# Patient Record
Sex: Female | Born: 1975 | Race: White | Hispanic: No | Marital: Married | Smoking: Never smoker
Health system: Southern US, Community
[De-identification: ages and names within clinical notes are randomized; demographics above are authoritative.]

## PROBLEM LIST (undated history)

## (undated) DIAGNOSIS — J45901 Unspecified asthma with (acute) exacerbation: Secondary | ICD-10-CM

## (undated) HISTORY — DX: Unspecified asthma with (acute) exacerbation: J45.901

## (undated) HISTORY — PX: TUBAL LIGATION: SHX77

## (undated) HISTORY — PX: ENDOMETRIAL ABLATION: SHX621

## (undated) HISTORY — PX: DILATION AND CURETTAGE OF UTERUS: SHX78

---

## 2005-02-24 ENCOUNTER — Emergency Department: Payer: Self-pay | Admitting: Emergency Medicine

## 2008-09-05 ENCOUNTER — Ambulatory Visit: Payer: Self-pay

## 2009-03-16 ENCOUNTER — Ambulatory Visit: Payer: Self-pay

## 2009-03-17 ENCOUNTER — Ambulatory Visit: Payer: Self-pay

## 2010-04-14 ENCOUNTER — Ambulatory Visit: Payer: Self-pay

## 2010-04-18 ENCOUNTER — Inpatient Hospital Stay: Payer: Self-pay

## 2012-04-22 ENCOUNTER — Ambulatory Visit: Payer: Self-pay

## 2012-05-02 ENCOUNTER — Ambulatory Visit: Payer: Self-pay

## 2012-05-05 LAB — PATHOLOGY REPORT

## 2015-02-01 NOTE — Op Note (Signed)
PATIENT NAMVernelle Villa:  Gul, Lashika R MR#:  [id number] DATE OF BIRTH:  [birth year]  DATE OF PROCEDURE:  05/02/2012  PREOPERATIVE DIAGNOSIS: Menorrhagia.   POSTOPERATIVE DIAGNOSIS: Menorrhagia.  PROCEDURES PERFORMED: Dilation and curettage, hysteroscopy, NovaSure endometrial ablation.   SURGEON: Deloris Pinghilip J. Luella Cookosenow, M.D.   OPERATIVE FINDINGS: No abnormal masses. Sounding length 9, cervical length 4, cavity length 5, cavity width 4, power 110, time 152.  DESCRIPTION OF PROCEDURE: After adequate general anesthesia, the patient was prepped and draped in routine fashion. The cervix was grasped with a Gerilyn PilgrimJacob tenaculum and dilated with ease. Visualization of the uterine cavity revealed the cavity to be totally free of abnormality. Uterine curettage was performed with return of minimal amount of tissue. A NovaSure endometrial ablation was performed in routine fashion in 1 minute, 52 seconds. The patient tolerated the procedure well and left the Operating Room in good condition. Sponge and needle counts were said to be correct at the end of the procedure.  ____________________________ Deloris PingPhilip J. Luella Cookosenow, MD pjr:slb D: 05/02/2012 12:27:27 ET T: 05/02/2012 12:55:27 ET JOB#: [id number]  cc: Deloris PingPhilip J. Luella Cookosenow, MD, <Dictator> Towana BadgerPHILIP J ROSENOW MD ELECTRONICALLY SIGNED 05/04/2012 22:30

## 2016-12-25 ENCOUNTER — Ambulatory Visit (INDEPENDENT_AMBULATORY_CARE_PROVIDER_SITE_OTHER): Payer: BC Managed Care – PPO | Admitting: Obstetrics and Gynecology

## 2016-12-25 ENCOUNTER — Encounter: Payer: Self-pay | Admitting: Obstetrics and Gynecology

## 2016-12-25 VITALS — BP 98/62 | HR 47 | Ht 64.0 in | Wt 128.0 lb

## 2016-12-25 DIAGNOSIS — Z01419 Encounter for gynecological examination (general) (routine) without abnormal findings: Secondary | ICD-10-CM | POA: Diagnosis not present

## 2016-12-25 DIAGNOSIS — Z1231 Encounter for screening mammogram for malignant neoplasm of breast: Secondary | ICD-10-CM | POA: Diagnosis not present

## 2016-12-25 DIAGNOSIS — Z1239 Encounter for other screening for malignant neoplasm of breast: Secondary | ICD-10-CM

## 2016-12-25 NOTE — Progress Notes (Signed)
Gynecology Annual Exam  PCP: Danella PentonMark F Miller, MD  Chief Complaint  Patient presents with  . Gynecologic Exam    History of Present Illness:  Ms. Tammy Villa is a 41 y.o. [id number] who LMP was No LMP recorded. Patient has had an ablation., presents today for her annual examination.  Her menses are absent,  She is has sex with males.  Last Pap: 2 year ago Results were: no abnormalities /neg HPV DNA negative Hx of STDs: none  Last mammogram: never There is no FH of breast cancer. There is no FH of ovarian cancer. The patient does not do self-breast exams.  Tobacco use: The patient denies current or previous tobacco use. Alcohol use: none Exercise: very active  She does get adequate calcium and Vitamin D in her diet.  The patient is sexually active. The patient wears seatbelts: yes.      Review of Systems: Review of Systems  Constitutional: Negative.   HENT: Negative.   Eyes: Negative.   Respiratory: Negative.   Cardiovascular: Negative.   Gastrointestinal: Negative.   Genitourinary: Negative.   Musculoskeletal: Negative.   Skin: Negative.   Neurological: Negative.   Endo/Heme/Allergies: Negative.   Psychiatric/Behavioral: Negative.     Past Medical History:  Diagnosis Date  . Asthma attack     Past Surgical History:  Procedure Laterality Date  . CESAREAN SECTION    . DILATION AND CURETTAGE OF UTERUS    . ENDOMETRIAL ABLATION    . TUBAL LIGATION      Medications: None      Allergies  Allergen Reactions  . Penicillins     Gynecologic History: No LMP recorded. Patient has had an ablation.  Obstetric History: [id number]  Social History   Social History  . Marital status: Married    Spouse name: N/A  . Number of children: N/A  . Years of education: N/A   Occupational History  . Not on file.   Social History Main Topics  . Smoking status: Never Smoker  . Smokeless tobacco: Never Used  . Alcohol use Yes  . Drug use: No  . Sexual activity: Yes   Birth control/ protection: Surgical   Other Topics Concern  . Not on file   Social History Narrative  . No narrative on file    Family History  Problem Relation Age of Onset  . Hypertension Mother   . Hypothyroidism Mother   . Diabetes Maternal Grandmother   . Esophageal cancer Maternal Grandfather      Physical Exam BP 98/62 (Patient Position: Sitting)   Pulse (!) 47   Ht 5\' 4"  (1.626 m)   Wt 128 lb (58.1 kg)   BMI 21.97 kg/m   General: NAD HEENT: normocephalic, anicteric Thyroid: no enlargement, no palpable nodules Pulmonary: No increased work of breathing, CTAB Cardiovascular: RRR, distal pulses 2+ Breast: Breast symmetrical, no tenderness, no palpable nodules or masses, no skin or nipple retraction present, no nipple discharge.  No axillary or supraclavicular lymphadenopathy. Abdomen: NABS, soft, non-tender, non-distended.  Umbilicus without lesions.  No hepatomegaly, splenomegaly or masses palpable. No evidence of hernia  Genitourinary:  External: Normal external female genitalia.  Normal urethral meatus, normal  Bartholin's and Skene's glands.    Vagina: Normal vaginal mucosa, no evidence of prolapse.    Cervix: Grossly normal in appearance, no bleeding  Uterus: Non-enlarged, mobile, normal contour.  No CMT  Adnexa: ovaries non-enlarged, no adnexal masses  Rectal: deferred  Lymphatic: no evidence of inguinal lymphadenopathy Extremities: no edema,  erythema, or tenderness Neurologic: Grossly intact Psychiatric: mood appropriate, affect full  Female chaperone present for pelvic and breast  portions of the physical exam  Assessment: 41 y.o. [id number] Here for routine annual gynecologic exam.  Doing well.  Plan:  1) STI screening was offered: declined  2) Mammogram: due. She will call [facility] to schedule.   3) Pap - ASCCP guidelines and rational discussed.  Patient opts for routine  screening interval (3-5 year intervals)  4) Routine healthcare  maintenance including cholesterol, diabetes screening per PCP, Dr. Hyacinth Meeker  5) Follow up 1 year for routine annual exam  Thomasene Mohair, MD 12/25/2016 4:04 PM

## 2017-04-03 ENCOUNTER — Ambulatory Visit: Payer: Self-pay

## 2017-04-11 ENCOUNTER — Ambulatory Visit
Admission: RE | Admit: 2017-04-11 | Discharge: 2017-04-11 | Disposition: A | Discharge status: Home / Self Care | Payer: BC Managed Care – PPO | Source: Ambulatory Visit | Attending: Obstetrics and Gynecology | Admitting: Obstetrics and Gynecology

## 2017-04-11 DIAGNOSIS — Z01419 Encounter for gynecological examination (general) (routine) without abnormal findings: Secondary | ICD-10-CM

## 2017-04-11 DIAGNOSIS — Z1239 Encounter for other screening for malignant neoplasm of breast: Secondary | ICD-10-CM

## 2017-04-11 DIAGNOSIS — Z1231 Encounter for screening mammogram for malignant neoplasm of breast: Secondary | ICD-10-CM | POA: Insufficient documentation

## 2018-02-17 DIAGNOSIS — E782 Mixed hyperlipidemia: Secondary | ICD-10-CM | POA: Insufficient documentation

## 2018-05-15 DIAGNOSIS — E28319 Asymptomatic premature menopause: Secondary | ICD-10-CM | POA: Insufficient documentation

## 2018-10-23 ENCOUNTER — Other Ambulatory Visit: Payer: Self-pay | Admitting: Internal Medicine

## 2018-10-23 DIAGNOSIS — Z1231 Encounter for screening mammogram for malignant neoplasm of breast: Secondary | ICD-10-CM

## 2018-11-11 ENCOUNTER — Ambulatory Visit
Admission: RE | Admit: 2018-11-11 | Discharge: 2018-11-11 | Disposition: A | Discharge status: Home / Self Care | Payer: BC Managed Care – PPO | Source: Ambulatory Visit | Attending: Internal Medicine | Admitting: Internal Medicine

## 2018-11-11 DIAGNOSIS — Z1231 Encounter for screening mammogram for malignant neoplasm of breast: Secondary | ICD-10-CM | POA: Diagnosis present

## 2018-11-13 ENCOUNTER — Other Ambulatory Visit: Payer: Self-pay | Admitting: Internal Medicine

## 2018-11-13 DIAGNOSIS — N6489 Other specified disorders of breast: Secondary | ICD-10-CM

## 2018-11-13 DIAGNOSIS — R928 Other abnormal and inconclusive findings on diagnostic imaging of breast: Secondary | ICD-10-CM

## 2018-11-20 ENCOUNTER — Ambulatory Visit
Admission: RE | Admit: 2018-11-20 | Discharge: 2018-11-20 | Disposition: A | Discharge status: Home / Self Care | Payer: BC Managed Care – PPO | Source: Ambulatory Visit | Attending: Internal Medicine | Admitting: Internal Medicine

## 2018-11-20 ENCOUNTER — Other Ambulatory Visit: Payer: Self-pay | Admitting: Internal Medicine

## 2018-11-20 DIAGNOSIS — N6489 Other specified disorders of breast: Secondary | ICD-10-CM | POA: Diagnosis present

## 2018-11-20 DIAGNOSIS — R928 Other abnormal and inconclusive findings on diagnostic imaging of breast: Secondary | ICD-10-CM | POA: Diagnosis not present

## 2019-12-13 ENCOUNTER — Ambulatory Visit: Payer: BC Managed Care – PPO | Attending: Internal Medicine

## 2019-12-13 DIAGNOSIS — Z23 Encounter for immunization: Secondary | ICD-10-CM | POA: Insufficient documentation

## 2019-12-13 NOTE — Progress Notes (Signed)
   Covid-19 Vaccination Clinic  Name:  Tammy Villa    MRN: [id number] DOB: 16-Nov-1975  12/13/2019  Tammy Villa was observed post Covid-19 immunization for 15 minutes without incidence. She was provided with Vaccine Information Sheet and instruction to access the V-Safe system.   Tammy Villa was instructed to call 911 with any severe reactions post vaccine: Marland Kitchen Difficulty breathing  . Swelling of your face and throat  . A fast heartbeat  . A bad rash all over your body  . Dizziness and weakness    Immunizations Administered    Name Date Dose VIS Date Route   Pfizer COVID-19 Vaccine 12/13/2019 11:24 AM 0.3 mL 09/25/2019 Intramuscular   Manufacturer: [employer], [employer]   Lot: [id number]   NDC: [address]-3

## 2020-01-05 ENCOUNTER — Ambulatory Visit: Payer: BC Managed Care – PPO | Attending: Internal Medicine

## 2020-01-05 DIAGNOSIS — Z23 Encounter for immunization: Secondary | ICD-10-CM

## 2020-01-05 NOTE — Progress Notes (Signed)
   Covid-19 Vaccination Clinic  Name:  AKILI CUDA    MRN: [id number] DOB: May 30, 1976  01/05/2020  Ms. Nong was observed post Covid-19 immunization for 15 minutes without incident. She was provided with Vaccine Information Sheet and instruction to access the V-Safe system.   Ms. Heard was instructed to call 911 with any severe reactions post vaccine: Marland Kitchen Difficulty breathing  . Swelling of face and throat  . A fast heartbeat  . A bad rash all over body  . Dizziness and weakness   Immunizations Administered    Name Date Dose VIS Date Route   Pfizer COVID-19 Vaccine 01/05/2020  9:12 AM 0.3 mL 09/25/2019 Intramuscular   Manufacturer: [employer], [employer]   Lot: [id number]   NDC: [address]-2

## 2020-03-10 ENCOUNTER — Other Ambulatory Visit: Payer: Self-pay | Admitting: Internal Medicine

## 2020-03-10 DIAGNOSIS — Z1231 Encounter for screening mammogram for malignant neoplasm of breast: Secondary | ICD-10-CM

## 2020-03-15 ENCOUNTER — Ambulatory Visit
Admission: RE | Admit: 2020-03-15 | Discharge: 2020-03-15 | Disposition: A | Discharge status: Home / Self Care | Payer: BC Managed Care – PPO | Source: Ambulatory Visit | Attending: Internal Medicine | Admitting: Internal Medicine

## 2020-03-15 DIAGNOSIS — Z1231 Encounter for screening mammogram for malignant neoplasm of breast: Secondary | ICD-10-CM | POA: Insufficient documentation

## 2020-03-23 ENCOUNTER — Ambulatory Visit (INDEPENDENT_AMBULATORY_CARE_PROVIDER_SITE_OTHER): Payer: BC Managed Care – PPO | Admitting: Obstetrics

## 2020-03-23 ENCOUNTER — Other Ambulatory Visit (HOSPITAL_COMMUNITY)
Admission: RE | Admit: 2020-03-23 | Discharge: 2020-03-23 | Disposition: A | Discharge status: Home / Self Care | Payer: BC Managed Care – PPO | Source: Ambulatory Visit | Attending: Obstetrics | Admitting: Obstetrics

## 2020-03-23 ENCOUNTER — Other Ambulatory Visit: Payer: Self-pay

## 2020-03-23 ENCOUNTER — Encounter: Payer: Self-pay | Admitting: Obstetrics

## 2020-03-23 VITALS — BP 120/80 | Ht 64.0 in | Wt 136.0 lb

## 2020-03-23 DIAGNOSIS — Z124 Encounter for screening for malignant neoplasm of cervix: Secondary | ICD-10-CM | POA: Diagnosis present

## 2020-03-23 DIAGNOSIS — Z01419 Encounter for gynecological examination (general) (routine) without abnormal findings: Secondary | ICD-10-CM | POA: Diagnosis not present

## 2020-03-23 DIAGNOSIS — Z0000 Encounter for general adult medical examination without abnormal findings: Secondary | ICD-10-CM

## 2020-03-23 NOTE — Progress Notes (Addendum)
Gynecology Annual Exam  PCP: Danella Penton, MD  Chief Complaint:  Chief Complaint  Patient presents with  . Gynecologic Exam    History of Present Illness: Tammy Villa is a 45 y.o. [id number] presents for annual exam. The patient has no complaints today.  Her menses are absent due to early menopause. Marland Kitchen Her last menstrual period was several years ago .Her last pap smear was donein 2016 and was normal per her report.The patient is  sexually active. She had an ablation.   Since her last visit, she has had no significant changes in her health.  Her past medical history is remarkable for Menorrhagia- treated with an ablation. Menopausal sxs treated with HRT (oral Estrogen and Progesterone) and managed by her PCP.  The patient does perform self breast exams. Her last mammogram was in 2016, results were NILM.   There is no family history of breast cancer. Genetic testing has not been done.   There is no family history of ovarian cancer. Genetic testing has not been done.  The patient denies smoking.  She reports drinking alcohol. She reports have 1-2 drinks per week.   She denies illegal drug use.  The patient reports exercising regularly.  The patient denies current symptoms of depression.    Review of Systems: ROS  Past Medical History:  Past Medical History:  Diagnosis Date  . Asthma attack     Past Surgical History:  Past Surgical History:  Procedure Laterality Date  . CESAREAN SECTION    . DILATION AND CURETTAGE OF UTERUS    . ENDOMETRIAL ABLATION    . TUBAL LIGATION      Family History:  Family History  Problem Relation Age of Onset  . Hypertension Mother   . Hypothyroidism Mother   . Diabetes Maternal Grandmother   . Esophageal cancer Maternal Grandfather   . Breast cancer Neg Hx     Social History:  Social History   Socioeconomic History  . Marital status: Married    Spouse name: Not on file  . Number of children: Not on file  . Years  of education: Not on file  . Highest education level: Not on file  Occupational History  . Not on file  Tobacco Use  . Smoking status: Never Smoker  . Smokeless tobacco: Never Used  Substance and Sexual Activity  . Alcohol use: Yes  . Drug use: No  . Sexual activity: Yes    Birth control/protection: Surgical  Other Topics Concern  . Not on file  Social History Narrative  . Not on file   Social Determinants of Health   Financial Resource Strain:   . Difficulty of Paying Living Expenses:   Food Insecurity:   . Worried About Programme researcher, broadcasting/film/video in the Last Year:   . Barista in the Last Year:   Transportation Needs:   . Freight forwarder (Medical):   Marland Kitchen Lack of Transportation (Non-Medical):   Physical Activity:   . Days of Exercise per Week:   . Minutes of Exercise per Session:   Stress:   . Feeling of Stress :   Social Connections:   . Frequency of Communication with Friends and Family:   . Frequency of Social Gatherings with Friends and Family:   . Attends Religious Services:   . Active Member of Clubs or Organizations:   . Attends Banker Meetings:   Marland Kitchen Marital Status:   Intimate Partner Violence:   .  Fear of Current or Ex-Partner:   . Emotionally Abused:   Marland Kitchen Physically Abused:   . Sexually Abused:     Allergies:  Allergies  Allergen Reactions  . Penicillins     Medications: Prior to Admission medications   Medication Sig Start Date End Date Taking? Authorizing Provider  escitalopram (LEXAPRO) 10 MG tablet Take 10 mg by mouth daily. 03/09/20  Yes [provider]  estradiol (ESTRACE) 1 MG tablet TAKE 1 TABLET(1 MG) BY MOUTH EVERY DAY 12/30/19  Yes [provider]  progesterone (PROMETRIUM) 100 MG capsule Take by mouth. 05/19/19 05/18/20 Yes [provider]  simvastatin (ZOCOR) 10 MG tablet Take 10 mg by mouth at bedtime. 02/08/20  Yes [provider]    Physical Exam Vitals: Blood pressure 120/80, height  5\' 4"  (1.626 m), weight 136 lb (61.7 kg).  General: NAD HEENT: normocephalic, anicteric Neck: no thyroid enlargement, no palpable nodules, no cervical lymphadenopathy  Pulmonary: No increased work of breathing, CTAB Cardiovascular: RRR, without murmur  Breast: Breast symmetrical, no tenderness, no palpable nodules or masses, no skin or nipple retraction present, no nipple discharge.  No axillary, infraclavicular or supraclavicular lymphadenopathy. Abdomen: Soft, non-tender, non-distended.  Umbilicus without lesions.  No hepatomegaly or masses palpable. No evidence of hernia. Genitourinary:  External: Normal external female genitalia.  Normal urethral meatus, normal  Bartholin's and Skene's glands.    Vagina: Normal vaginal mucosa, no evidence of prolapse.    Cervix: Grossly normal in appearance, no bleeding, non-tender  Uterus: Anteverted, normal size, shape, and consistency, mobile, and non-tender  Adnexa: No adnexal masses, non-tender  Rectal: deferred  Lymphatic: no evidence of inguinal lymphadenopathy Extremities: no edema, erythema, or tenderness Neurologic: Grossly intact Psychiatric: mood appropriate, affect full     Assessment: 44 y.o. [id number] No problem-specific Assessment & Plan notes found for this encounter.   Plan:  Reviewed preventive care, including annual mammograms, colonoscopy q 10 years  1) Breast cancer screening - recommend monthly self breast exam. Mammogram is up to date.  2) STI screening was offered and declined.  3) Cervical cancer screening - Pap was done. ASCCP guidelines and rational discussed.  Patient opts for every 5 years screening interval  4) Contraception - Education given regarding options for contraception  5) Routine healthcare maintenance including cholesterol and diabetes screening managed by PCP   6) RTC in one year for next annual GYN physical. 7) Discussed her HRT use and the option of switching from oral Estrogen to weekly patches  to avoid the "first pass" Per her request, labs drawn today: Vitamin D, TSH, and CMP.

## 2020-03-24 ENCOUNTER — Encounter: Payer: Self-pay | Admitting: Obstetrics

## 2020-03-24 LAB — CBC WITH DIFFERENTIAL
Basophils Absolute: 0 10*3/uL (ref 0.0–0.2)
Basos: 1 %
EOS (ABSOLUTE): 0.1 10*3/uL (ref 0.0–0.4)
Eos: 3 %
Hematocrit: 41.8 % (ref 34.0–46.6)
Hemoglobin: 13.6 g/dL (ref 11.1–15.9)
Immature Grans (Abs): 0 10*3/uL (ref 0.0–0.1)
Immature Granulocytes: 0 %
Lymphocytes Absolute: 1.8 10*3/uL (ref 0.7–3.1)
Lymphs: 40 %
MCH: 28.5 pg (ref 26.6–33.0)
MCHC: 32.5 g/dL (ref 31.5–35.7)
MCV: 88 fL (ref 79–97)
Monocytes Absolute: 0.4 10*3/uL (ref 0.1–0.9)
Monocytes: 8 %
Neutrophils Absolute: 2.2 10*3/uL (ref 1.4–7.0)
Neutrophils: 48 %
RBC: 4.77 x10E6/uL (ref 3.77–5.28)
RDW: 12 % (ref 11.7–15.4)
WBC: 4.5 10*3/uL (ref 3.4–10.8)

## 2020-03-24 LAB — COMPREHENSIVE METABOLIC PANEL
ALT: 11 IU/L (ref 0–32)
AST: 20 IU/L (ref 0–40)
Albumin/Globulin Ratio: 1.9 (ref 1.2–2.2)
Albumin: 4.6 g/dL (ref 3.8–4.8)
Alkaline Phosphatase: 71 IU/L (ref 48–121)
BUN/Creatinine Ratio: 19 (ref 9–23)
BUN: 17 mg/dL (ref 6–24)
Bilirubin Total: 0.3 mg/dL (ref 0.0–1.2)
CO2: 25 mmol/L (ref 20–29)
Calcium: 8.9 mg/dL (ref 8.7–10.2)
Chloride: 102 mmol/L (ref 96–106)
Creatinine, Ser: 0.89 mg/dL (ref 0.57–1.00)
GFR calc Af Amer: 91 mL/min/{1.73_m2} (ref >59)
GFR calc non Af Amer: 79 mL/min/{1.73_m2} (ref >59)
Globulin, Total: 2.4 g/dL (ref 1.5–4.5)
Glucose: 84 mg/dL (ref 65–99)
Potassium: 4.6 mmol/L (ref 3.5–5.2)
Sodium: 139 mmol/L (ref 134–144)
Total Protein: 7 g/dL (ref 6.0–8.5)

## 2020-03-24 LAB — VITAMIN D 25 HYDROXY (VIT D DEFICIENCY, FRACTURES): Vit D, 25-Hydroxy: 32.8 ng/mL (ref 30.0–100.0)

## 2020-03-24 LAB — TSH: TSH: 2.7 u[IU]/mL (ref 0.450–4.500)

## 2020-03-24 NOTE — Progress Notes (Signed)
Letter to patient printed out with her recent lab values. Will have this mailed as she has not set up My Chart. Mirna Mires, CNM  03/24/2020 1:30 PM

## 2020-03-25 LAB — CYTOLOGY - PAP
Comment: NEGATIVE
Diagnosis: NEGATIVE
High risk HPV: NEGATIVE

## 2020-04-29 ENCOUNTER — Other Ambulatory Visit: Payer: Self-pay | Admitting: Physician Assistant

## 2020-04-29 ENCOUNTER — Other Ambulatory Visit: Payer: Self-pay

## 2020-04-29 ENCOUNTER — Ambulatory Visit
Admission: RE | Admit: 2020-04-29 | Discharge: 2020-04-29 | Disposition: A | Discharge status: Home / Self Care | Payer: BC Managed Care – PPO | Source: Ambulatory Visit | Attending: Physician Assistant | Admitting: Physician Assistant

## 2020-04-29 ENCOUNTER — Other Ambulatory Visit (HOSPITAL_COMMUNITY): Payer: Self-pay | Admitting: Physician Assistant

## 2020-04-29 DIAGNOSIS — M79606 Pain in leg, unspecified: Secondary | ICD-10-CM

## 2020-06-06 ENCOUNTER — Telehealth: Payer: Self-pay

## 2020-06-06 NOTE — Telephone Encounter (Signed)
Patient reports her labs from 03/23/20 w/[facility] were denied and are usually covered. Patient inquiring if they need a different code or if it can be refiled with a different code or more information as to why it is needed. LD#[phone]

## 2020-06-13 NOTE — Telephone Encounter (Signed)
LMVM to notify patient I am reviewing the lab and coding. Will contact her back when determined if lab is able to be refiled w/another code.

## 2020-06-13 NOTE — Telephone Encounter (Signed)
Spoke w/[facility]. Given code Z13.21 (screening for nutritional disorder) to refile for both Vitamin D and HgbA1c. Ignore bills for next 30-45 days. Inquire w/[facility] for any further questions.

## 2020-06-13 NOTE — Telephone Encounter (Signed)
Pt aware.

## 2021-01-23 IMAGING — MG DIGITAL SCREENING BILATERAL MAMMOGRAM WITH TOMO AND CAD
8 series · 9 of 24 positions shown · non-contrast
Comparison: Previous exam(s).

CLINICAL DATA: Screening.

EXAM:
DIGITAL SCREENING BILATERAL MAMMOGRAM WITH TOMO AND CAD

[R CC synth-2D]
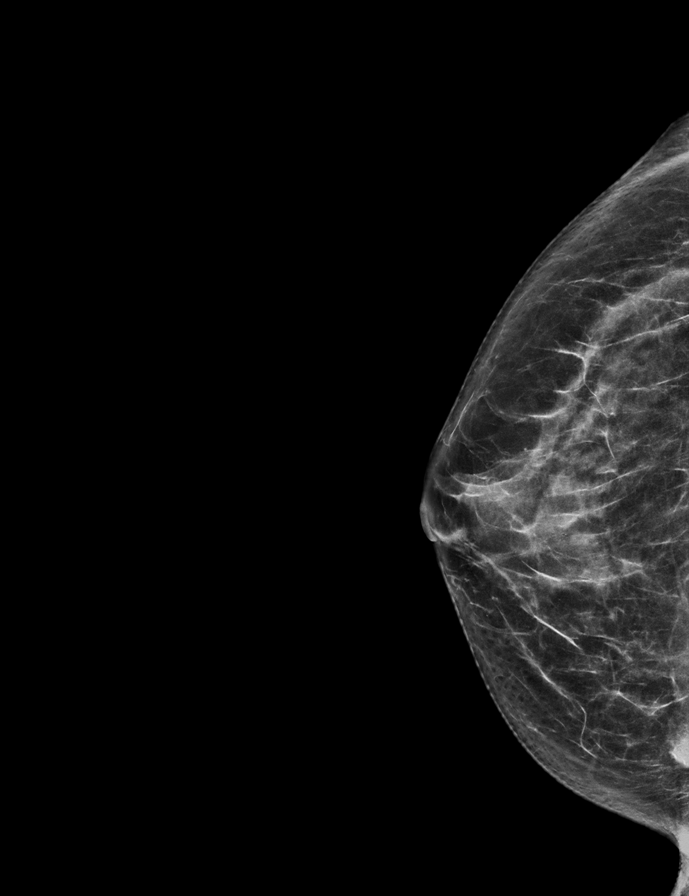

[L MLO synth-2D]
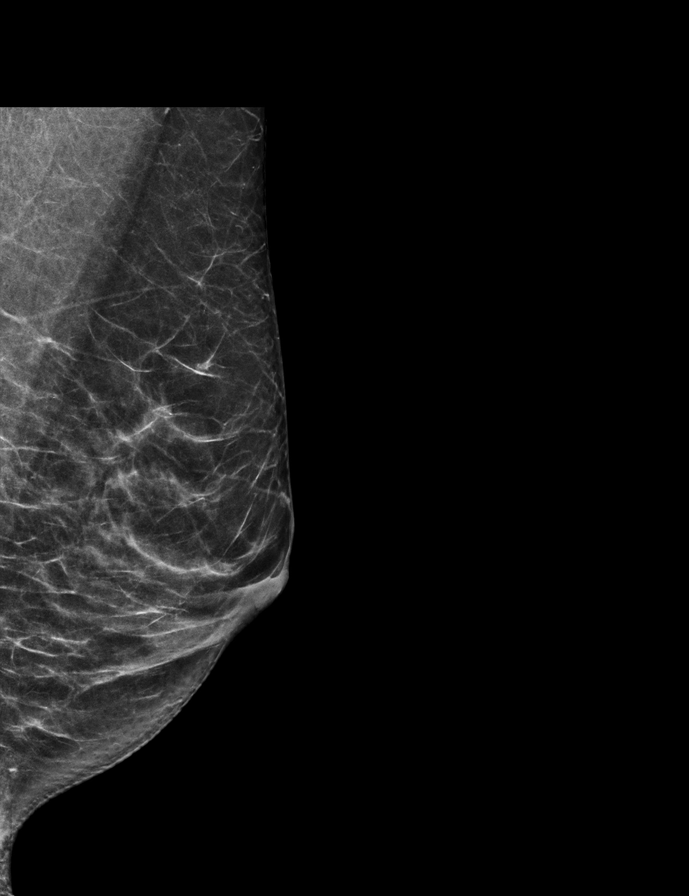

[L CC synth-2D]
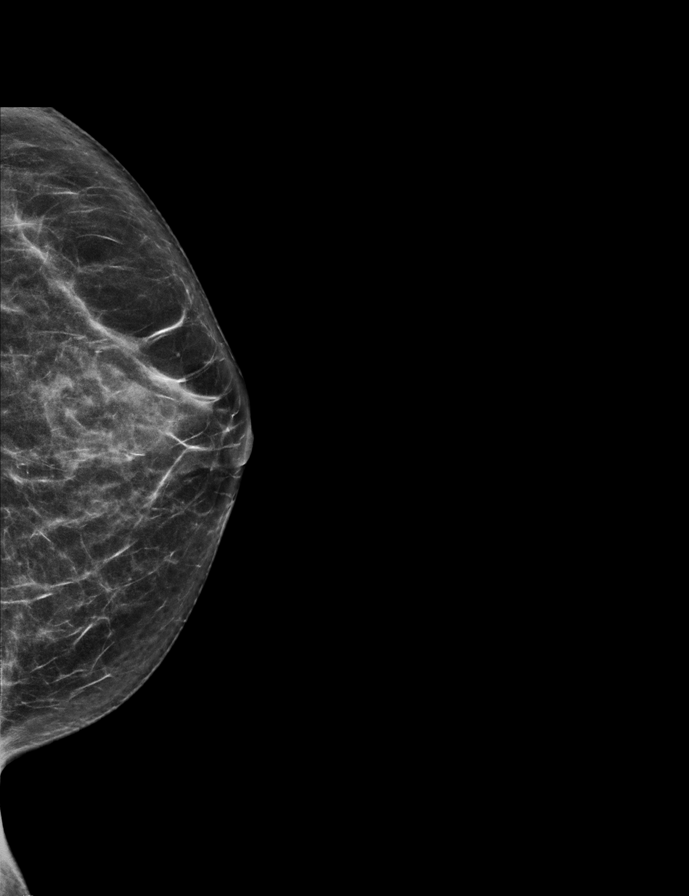

[R MLO synth-2D]
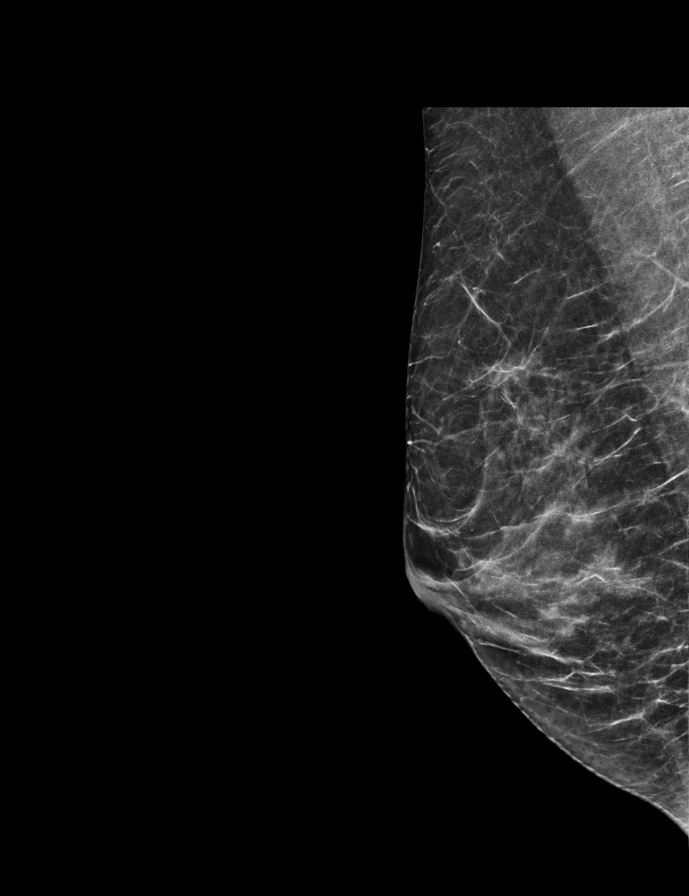

[L MLO tomo · 2 of 57 frames shown]
[frame 19/57]
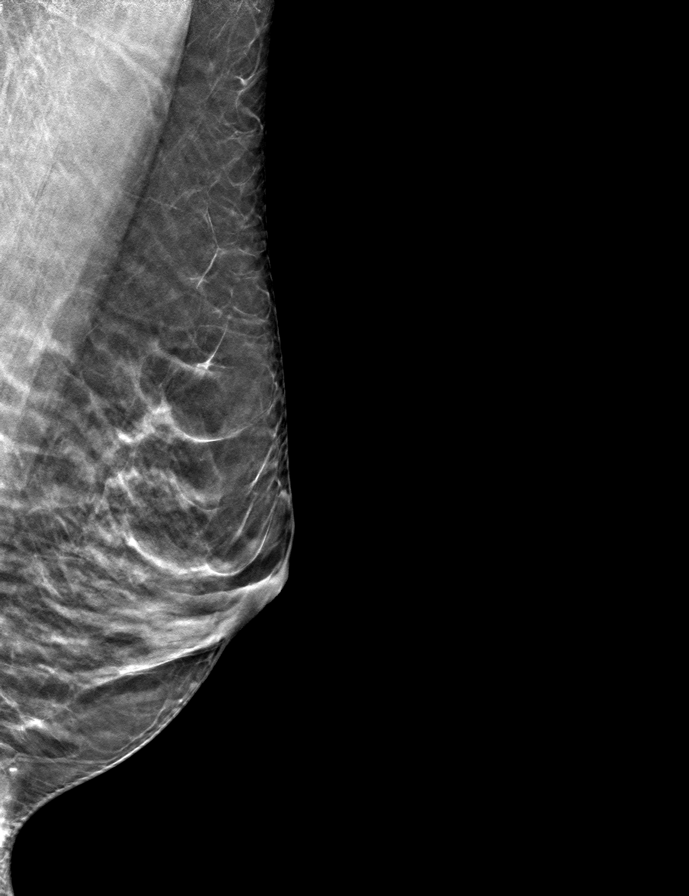
[frame 29/57]
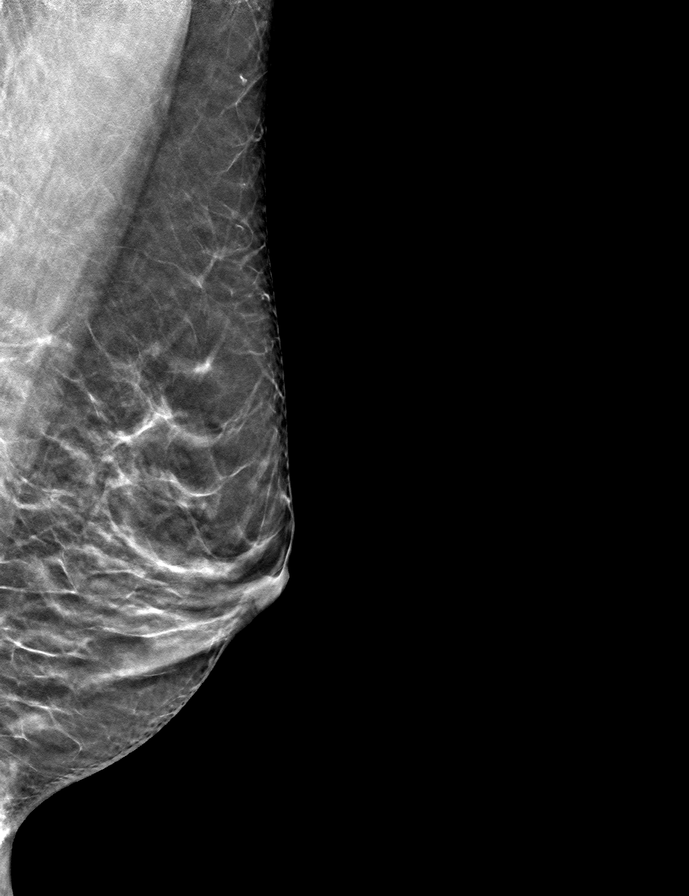

[R CC tomo · tomo slice 31/62.0]
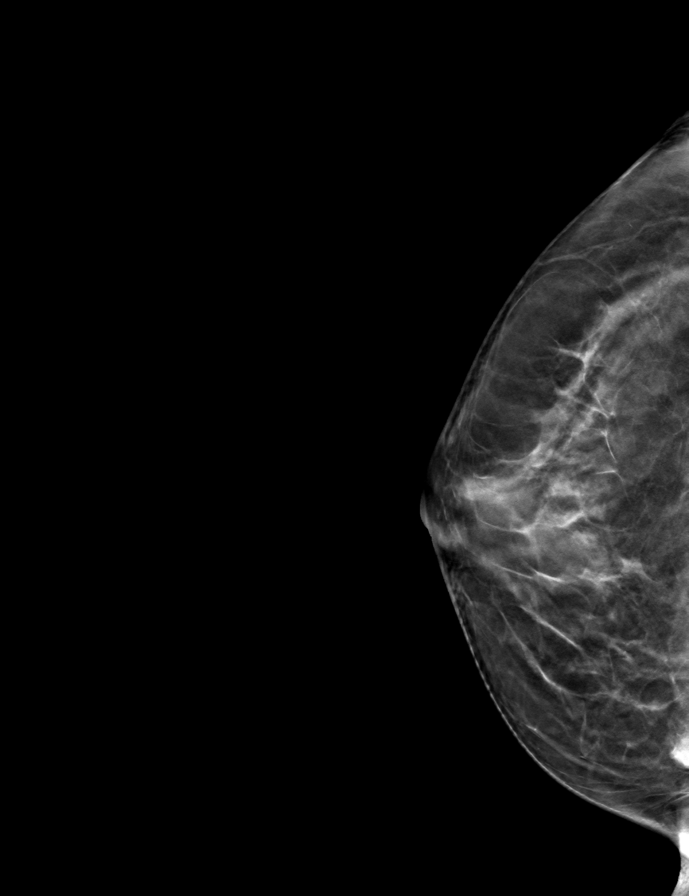

[R MLO tomo · tomo slice 25/49.0]
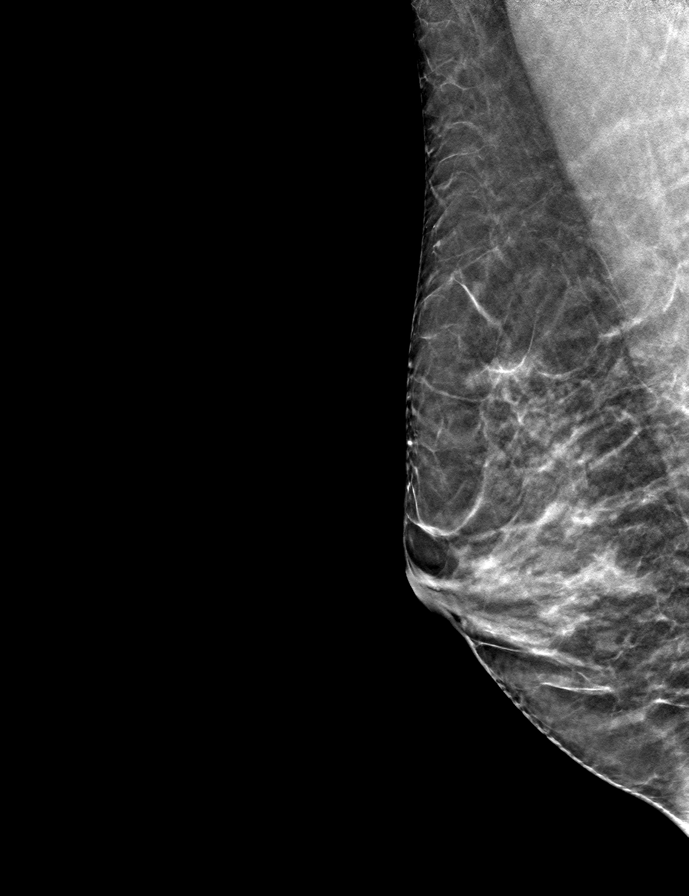

[L CC tomo · tomo slice 30/59.0]
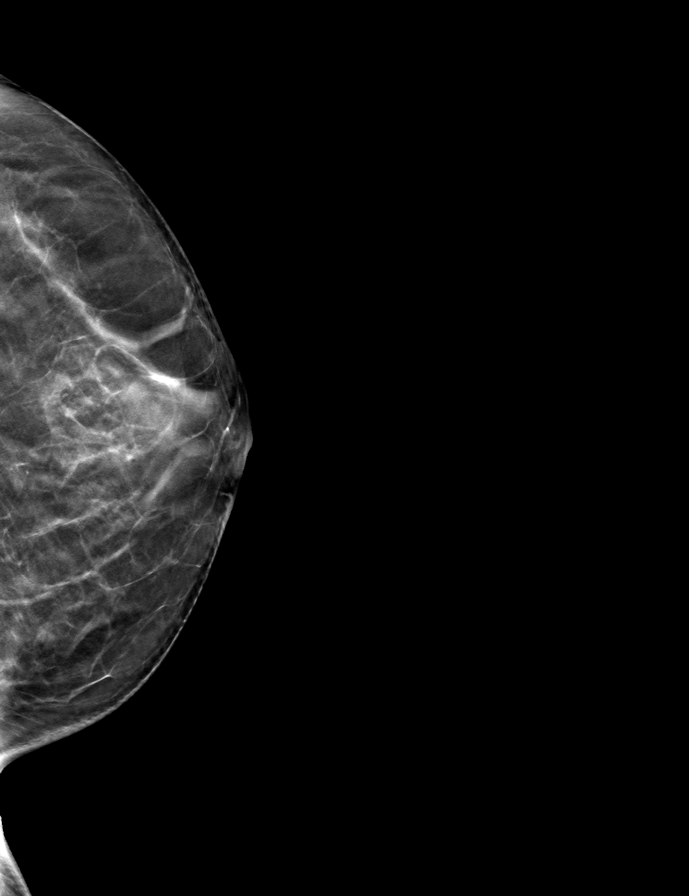

[9 of 24 positions shown; findings below may reference images not displayed]

ACR Breast Density Category c: The breast tissue is heterogeneously
dense, which may obscure small masses.
FINDINGS: In the right breast, a possible asymmetry warrants further
evaluation. In the left breast, no findings suspicious for
malignancy. Images were processed with CAD.
IMPRESSION: Further evaluation is suggested for possible asymmetry in the right
breast.

RECOMMENDATION:
Diagnostic mammogram and possibly ultrasound of the right breast.
(Code:[id number])

The patient will be contacted regarding the findings, and additional
imaging will be scheduled.

BI-RADS CATEGORY  0: Incomplete. Need additional imaging evaluation
and/or prior mammograms for comparison.

## 2021-05-29 ENCOUNTER — Other Ambulatory Visit: Payer: Self-pay | Admitting: Internal Medicine

## 2021-05-29 DIAGNOSIS — Z1231 Encounter for screening mammogram for malignant neoplasm of breast: Secondary | ICD-10-CM

## 2021-06-06 ENCOUNTER — Telehealth: Payer: Self-pay

## 2021-06-06 NOTE — Telephone Encounter (Signed)
Pt calling; believes she has BV again; get them a lot; usually uses metronidazole rx instead of coming in; has a hard time coming in with work.  [phone]

## 2021-06-06 NOTE — Telephone Encounter (Signed)
Patient is scheduled for 06/08/21 with [facility] for pelvic and 07/11/21 with [facility] for annual

## 2021-06-08 ENCOUNTER — Other Ambulatory Visit: Payer: Self-pay

## 2021-06-08 ENCOUNTER — Ambulatory Visit (INDEPENDENT_AMBULATORY_CARE_PROVIDER_SITE_OTHER): Payer: BC Managed Care – PPO | Admitting: Obstetrics and Gynecology

## 2021-06-08 ENCOUNTER — Encounter: Payer: Self-pay | Admitting: Obstetrics and Gynecology

## 2021-06-08 VITALS — BP 100/62 | HR 62 | Ht 64.0 in | Wt 141.0 lb

## 2021-06-08 DIAGNOSIS — R399 Unspecified symptoms and signs involving the genitourinary system: Secondary | ICD-10-CM

## 2021-06-08 DIAGNOSIS — N898 Other specified noninflammatory disorders of vagina: Secondary | ICD-10-CM

## 2021-06-08 LAB — POCT WET PREP WITH KOH
Clue Cells Wet Prep HPF POC: NEGATIVE
KOH Prep POC: NEGATIVE
Trichomonas, UA: NEGATIVE
Yeast Wet Prep HPF POC: NEGATIVE

## 2021-06-08 LAB — POCT URINALYSIS DIPSTICK
Bilirubin, UA: NEGATIVE
Blood, UA: NEGATIVE
Glucose, UA: NEGATIVE
Ketones, UA: NEGATIVE
Leukocytes, UA: NEGATIVE
Nitrite, UA: NEGATIVE
Protein, UA: NEGATIVE
Spec Grav, UA: 1.01 (ref 1.010–1.025)
pH, UA: 6 (ref 5.0–8.0)

## 2021-06-08 NOTE — Progress Notes (Signed)
Tammy Penton, MD   Chief Complaint  Patient presents with   Vaginal Discharge    BV/UTI. Some urgency w/urinating. Had Cipro on hand, took, UTI symptoms are better. Itching w/o odor or d/c also subsided past few days.    HPI:      Ms. Tammy Villa is a 45 y.o. [phone] whose LMP was No LMP recorded. Patient has had an ablation., presents today for UTI sx of urgency, frequency with good flow, dysuria, and mild pelvic discomfort over the wknd, no hematuria, LBP, fevers. Had leftover cipro from a prior UTI and took BID for 3 days. UTI sx resolved. Had increased caffeine use last wk, now drinking more water. Also has some mild vaginal itching without d/c, fishy odor. No meds to treat. Hx of BV in past. Just wants to make sure doesn't need tx.   Past Medical History:  Diagnosis Date   Asthma attack     Past Surgical History:  Procedure Laterality Date   CESAREAN SECTION     DILATION AND CURETTAGE OF UTERUS     ENDOMETRIAL ABLATION     TUBAL LIGATION      Family History  Problem Relation Age of Onset   Hypertension Mother    Hypothyroidism Mother    Diabetes Maternal Grandmother    Esophageal cancer Maternal Grandfather    Breast cancer Neg Hx     Social History   Socioeconomic History   Marital status: Married    Spouse name: Not on file   Number of children: Not on file   Years of education: Not on file   Highest education level: Not on file  Occupational History   Not on file  Tobacco Use   Smoking status: Never   Smokeless tobacco: Never  Substance and Sexual Activity   Alcohol use: Yes   Drug use: No   Sexual activity: Yes    Birth control/protection: Surgical  Other Topics Concern   Not on file  Social History Narrative   Not on file   Social Determinants of Health   Financial Resource Strain: Not on file  Food Insecurity: Not on file  Transportation Needs: Not on file  Physical Activity: Not on file  Stress: Not on file  Social Connections:  Not on file  Intimate Partner Violence: Not on file    Outpatient Medications Prior to Visit  Medication Sig Dispense Refill   albuterol (VENTOLIN HFA) 108 (90 Base) MCG/ACT inhaler Inhale into the lungs.     Cyanocobalamin 2500 MCG SUBL Place under the tongue.     escitalopram (LEXAPRO) 10 MG tablet Take 10 mg by mouth daily.     estradiol (ESTRACE) 1 MG tablet TAKE 1 TABLET(1 MG) BY MOUTH EVERY DAY     progesterone (PROMETRIUM) 100 MG capsule Take by mouth.     simvastatin (ZOCOR) 10 MG tablet Take 10 mg by mouth at bedtime.     tretinoin (RETIN-A) 0.025 % cream Apply topically.     progesterone (PROMETRIUM) 100 MG capsule Take by mouth.     No facility-administered medications prior to visit.      ROS:  Review of Systems  Constitutional:  Negative for fever.  Gastrointestinal:  Negative for blood in stool, constipation, diarrhea, nausea and vomiting.  Genitourinary:  Positive for dysuria, frequency and urgency. Negative for dyspareunia, flank pain, hematuria, vaginal bleeding, vaginal discharge and vaginal pain.  Musculoskeletal:  Negative for back pain.  Skin:  Negative for rash.  BREAST:  No symptoms   OBJECTIVE:   Vitals:  BP 100/62 (Cuff Size: Normal)   Pulse 62   Ht 5\' 4"  (1.626 m)   Wt 141 lb (64 kg)   BMI 24.20 kg/m   Physical Exam Vitals reviewed.  Constitutional:      Appearance: She is well-developed.  Pulmonary:     Effort: Pulmonary effort is normal.  Genitourinary:    General: Normal vulva.     Pubic Area: No rash.      Labia:        Right: No rash, tenderness or lesion.        Left: No rash, tenderness or lesion.      Vagina: Normal. No vaginal discharge, erythema or tenderness.     Cervix: Normal.     Uterus: Normal. Not enlarged and not tender.      Adnexa: Right adnexa normal and left adnexa normal.       Right: No mass or tenderness.         Left: No mass or tenderness.    Musculoskeletal:        General: Normal range of motion.      Cervical back: Normal range of motion.  Skin:    General: Skin is warm and dry.  Neurological:     General: No focal deficit present.     Mental Status: She is alert and oriented to person, place, and time.  Psychiatric:        Mood and Affect: Mood normal.        Behavior: Behavior normal.        Thought Content: Thought content normal.        Judgment: Judgment normal.    Results: Results for orders placed or performed in visit on 06/08/21 (from the past 24 hour(s))  POCT Urinalysis Dipstick     Status: Normal   Collection Time: 06/08/21  4:59 PM  Result Value Ref Range   Color, UA yellow    Clarity, UA clear    Glucose, UA Negative Negative   Bilirubin, UA neg    Ketones, UA neg    Spec Grav, UA 1.010 1.010 - 1.025   Blood, UA neg    pH, UA 6.0 5.0 - 8.0   Protein, UA Negative Negative   Urobilinogen, UA     Nitrite, UA neg    Leukocytes, UA Negative Negative   Appearance     Odor    POCT Wet Prep with KOH     Status: Normal   Collection Time: 06/08/21  4:59 PM  Result Value Ref Range   Trichomonas, UA Negative    Clue Cells Wet Prep HPF POC neg    Epithelial Wet Prep HPF POC     Yeast Wet Prep HPF POC neg    Bacteria Wet Prep HPF POC     RBC Wet Prep HPF POC     WBC Wet Prep HPF POC     KOH Prep POC Negative Negative     Assessment/Plan: UTI symptoms - Plan: POCT Urinalysis Dipstick; resolved after 3 days cipro tx. Neg UA today. Reassurance. F/u prn.   Vaginal itching - Plan: POCT Wet Prep with KOH; neg wet prep/exam. Will treat with diflucan if sx persist/worsen. F/u prn.     Return if symptoms worsen or fail to improve.  Veralyn Lopp B. Zelie Asbill, PA-C 06/08/2021 5:03 PM

## 2021-06-09 ENCOUNTER — Ambulatory Visit
Admission: RE | Admit: 2021-06-09 | Discharge: 2021-06-09 | Disposition: A | Discharge status: Home / Self Care | Payer: BC Managed Care – PPO | Source: Ambulatory Visit | Attending: Internal Medicine | Admitting: Internal Medicine

## 2021-06-09 DIAGNOSIS — Z1231 Encounter for screening mammogram for malignant neoplasm of breast: Secondary | ICD-10-CM | POA: Diagnosis not present

## 2021-07-11 ENCOUNTER — Ambulatory Visit: Payer: BC Managed Care – PPO | Admitting: Obstetrics

## 2021-08-01 ENCOUNTER — Encounter: Payer: Self-pay | Admitting: Obstetrics

## 2021-08-01 ENCOUNTER — Ambulatory Visit (INDEPENDENT_AMBULATORY_CARE_PROVIDER_SITE_OTHER): Payer: BC Managed Care – PPO | Admitting: Obstetrics

## 2021-08-01 ENCOUNTER — Other Ambulatory Visit: Payer: Self-pay

## 2021-08-01 VITALS — BP 112/66 | HR 52 | Ht 64.0 in | Wt 140.0 lb

## 2021-08-01 DIAGNOSIS — Z01419 Encounter for gynecological examination (general) (routine) without abnormal findings: Secondary | ICD-10-CM | POA: Diagnosis not present

## 2021-08-01 NOTE — Progress Notes (Signed)
Gynecology Annual Exam  PCP: Danella Penton, MD  Chief Complaint:  Chief Complaint  Patient presents with   Gynecologic Exam    Annual - no concerns. RM 4    History of Present Illness: Patient is a 45 y.o. [id number] presents for annual exam. The patient has no complaints today.  She sees Dr. Hyacinth Meeker, [facility]. She has recently had a mammogram, and Dr. Hyacinth Meeker renewed her HRT for the year.   LMP: No LMP recorded. Patient has had an ablation.she had a BTL after a Csection as well  Consider herself perimenopausal.  The patient is sexually active. She currently uses HRT She denies dyspareunia.  The patient does perform self breast exams.  There is no notable family history of breast or ovarian cancer in her family.  The patient wears seatbelts: yes.   The patient has regular exercise: no.    The patient denies current symptoms of depression.    Review of Systems: ROS  Past Medical History:  Patient Active Problem List   Diagnosis Date Noted   Early menopause 05/15/2018    Formatting of this note might be different from the original. Age 45, estrogen started 2/20    Hyperlipidemia, mixed 02/17/2018    Formatting of this note might be different from the original. Normal carotid ultrasound 2019     Past Surgical History:  Past Surgical History:  Procedure Laterality Date   CESAREAN SECTION     DILATION AND CURETTAGE OF UTERUS     ENDOMETRIAL ABLATION     TUBAL LIGATION      Gynecologic History:  No LMP recorded. Patient has had an ablation. Contraception:  uses HRT, and has had an ablation Last Pap: Results were: 2020 no abnormalities  Last mammogram: 2022 Results were: BI-RAD I  Obstetric History: [id number]  Family History:  Family History  Problem Relation Age of Onset   Hypertension Mother    Hypothyroidism Mother    Diabetes Maternal Grandmother    Esophageal cancer Maternal Grandfather    Breast cancer Neg Hx     Social History:   Social History   Socioeconomic History   Marital status: Married    Spouse name: Not on file   Number of children: Not on file   Years of education: Not on file   Highest education level: Not on file  Occupational History   Not on file  Tobacco Use   Smoking status: Never   Smokeless tobacco: Never  Vaping Use   Vaping Use: Never used  Substance and Sexual Activity   Alcohol use: Yes   Drug use: No   Sexual activity: Yes    Birth control/protection: Surgical  Other Topics Concern   Not on file  Social History Narrative   Not on file   Social Determinants of Health   Financial Resource Strain: Not on file  Food Insecurity: Not on file  Transportation Needs: Not on file  Physical Activity: Not on file  Stress: Not on file  Social Connections: Not on file  Intimate Partner Violence: Not on file    Allergies:  Allergies  Allergen Reactions   Penicillins     Medications: Prior to Admission medications   Medication Sig Start Date End Date Taking? Authorizing Provider  albuterol (VENTOLIN HFA) 108 (90 Base) MCG/ACT inhaler Inhale into the lungs. 03/15/21 03/15/22 Yes [provider]  Cyanocobalamin 2500 MCG SUBL Place under the tongue.   Yes [provider]  escitalopram (  LEXAPRO) 10 MG tablet Take 10 mg by mouth daily. 03/09/20  Yes [provider]  estradiol (ESTRACE) 1 MG tablet TAKE 1 TABLET(1 MG) BY MOUTH EVERY DAY 12/30/19  Yes [provider]  simvastatin (ZOCOR) 10 MG tablet Take 10 mg by mouth at bedtime. 02/08/20  Yes [provider]  tretinoin (RETIN-A) 0.025 % cream Apply topically. 03/03/21  Yes [provider]  progesterone (PROMETRIUM) 100 MG capsule Take by mouth. 05/19/19 05/18/20  [provider]  progesterone (PROMETRIUM) 100 MG capsule Take by mouth. 07/04/20 07/04/21  [provider]    Physical Exam Vitals: Blood pressure 112/66, pulse (!) 52, height 5\' 4"  (1.626 m), weight 140 lb (63.5  kg).  General: NAD HEENT: normocephalic, anicteric Thyroid: no enlargement, no palpable nodules Pulmonary: No increased work of breathing, CTAB Cardiovascular: RRR, distal pulses 2+ Breast: Breast symmetrical, no tenderness, no palpable nodules or masses, no skin or nipple retraction present, no nipple discharge.  No axillary or supraclavicular lymphadenopathy. Abdomen: NABS, soft, non-tender, non-distended.  Umbilicus without lesions.  No hepatomegaly, splenomegaly or masses palpable. No evidence of hernia  Genitourinary:  External: Normal external female genitalia.  Normal urethral meatus, normal Bartholin's and Skene's glands.    Vagina: Normal vaginal mucosa, no evidence of prolapse.    Cervix: Grossly normal in appearance, no bleeding  Uterus: Non-enlarged, mobile, normal contour.  No CMT  Adnexa: ovaries non-enlarged, no adnexal masses  Rectal: deferred  Lymphatic: no evidence of inguinal lymphadenopathy Extremities: no edema, erythema, or tenderness Neurologic: Grossly intact Psychiatric: mood appropriate, affect full  Female chaperone present for pelvic and breast  portions of the physical exam    Assessment: 45 y.o. 54 routine annual exam  Plan: Problem List Items Addressed This Visit   None Visit Diagnoses     Women's annual routine gynecological examination    -  Primary       1) Mammogram - recommend yearly screening mammogram.  Mammogram Is up to date   2) STI screening  was notoffered and therefore not obtained  3) ASCCP guidelines and rational discussed.  Patient opts for every 3 years screening interval  4) Contraception - the patient is currently using  tubal ligation.  She is happy with her current form of contraception and plans to continue  5) Colonoscopy -- Screening recommended starting at age 43 for average risk individuals, age 7 for individuals deemed at increased risk (including African Americans) and recommended to continue until age 45.   For patient age 53-85 individualized approach is recommended.  Gold standard screening is via colonoscopy, Cologuard screening is an acceptable alternative for patient unwilling or unable to undergo colonoscopy.  "Colorectal cancer screening for average?risk adults: 2018 guideline update from the American Cancer Society"CA: A Cancer Journal for Clinicians: Mar 13, 2017   6) Routine healthcare maintenance including cholesterol, diabetes screening discussed managed by PCP  7) Return in about 1 year (around 08/01/2022) for annual.   08/03/2022, CNM  08/01/2021 3:57 PM   [facility], [facility] 08/01/2021, 3:57 PM

## 2021-08-02 ENCOUNTER — Encounter: Payer: Self-pay | Admitting: Obstetrics

## 2022-09-04 ENCOUNTER — Emergency Department
Admission: EM | Admit: 2022-09-04 | Discharge: 2022-09-05 | Disposition: A | Discharge status: Home / Self Care | Payer: BC Managed Care – PPO | Attending: Emergency Medicine | Admitting: Emergency Medicine

## 2022-09-04 ENCOUNTER — Other Ambulatory Visit: Payer: Self-pay

## 2022-09-04 DIAGNOSIS — K2900 Acute gastritis without bleeding: Secondary | ICD-10-CM | POA: Diagnosis not present

## 2022-09-04 DIAGNOSIS — R748 Abnormal levels of other serum enzymes: Secondary | ICD-10-CM | POA: Diagnosis not present

## 2022-09-04 DIAGNOSIS — Z7951 Long term (current) use of inhaled steroids: Secondary | ICD-10-CM | POA: Diagnosis not present

## 2022-09-04 DIAGNOSIS — J45909 Unspecified asthma, uncomplicated: Secondary | ICD-10-CM | POA: Insufficient documentation

## 2022-09-04 DIAGNOSIS — E876 Hypokalemia: Secondary | ICD-10-CM | POA: Diagnosis not present

## 2022-09-04 DIAGNOSIS — R1013 Epigastric pain: Secondary | ICD-10-CM | POA: Diagnosis present

## 2022-09-04 LAB — CBC
HCT: 39.7 % (ref 36.0–46.0)
Hemoglobin: 13.1 g/dL (ref 12.0–15.0)
MCH: 28.1 pg (ref 26.0–34.0)
MCHC: 33 g/dL (ref 30.0–36.0)
MCV: 85.2 fL (ref 80.0–100.0)
Platelets: 175 10*3/uL (ref 150–400)
RBC: 4.66 MIL/uL (ref 3.87–5.11)
RDW: 12 % (ref 11.5–15.5)
WBC: 8.1 10*3/uL (ref 4.0–10.5)
nRBC: 0 % (ref 0.0–0.2)

## 2022-09-04 LAB — COMPREHENSIVE METABOLIC PANEL
ALT: 12 U/L (ref 0–44)
AST: 22 U/L (ref 15–41)
Albumin: 4.1 g/dL (ref 3.5–5.0)
Alkaline Phosphatase: 74 U/L (ref 38–126)
Anion gap: 7 (ref 5–15)
BUN: 16 mg/dL (ref 6–20)
CO2: 26 mmol/L (ref 22–32)
Calcium: 9.5 mg/dL (ref 8.9–10.3)
Chloride: 105 mmol/L (ref 98–111)
Creatinine, Ser: 0.85 mg/dL (ref 0.44–1.00)
GFR, Estimated: >60 mL/min (ref >60)
Glucose, Bld: 106 mg/dL — ABNORMAL HIGH (ref 70–99)
Potassium: 3.3 mmol/L — ABNORMAL LOW (ref 3.5–5.1)
Sodium: 138 mmol/L (ref 135–145)
Total Bilirubin: 0.1 mg/dL — ABNORMAL LOW (ref 0.3–1.2)
Total Protein: 7.4 g/dL (ref 6.5–8.1)

## 2022-09-04 LAB — LIPASE, BLOOD: Lipase: 64 U/L — ABNORMAL HIGH (ref 11–51)

## 2022-09-04 MED ORDER — FAMOTIDINE IN NACL 20-0.9 MG/50ML-% IV SOLN
20.0000 mg | Freq: Once | INTRAVENOUS | Status: AC
  Administered 2022-09-05: 20 mg via INTRAVENOUS
  Filled 2022-09-04: qty 50

## 2022-09-04 MED ORDER — HYDROMORPHONE HCL 1 MG/ML IJ SOLN
0.5000 mg | Freq: Once | INTRAMUSCULAR | Status: AC
  Administered 2022-09-05: 0.5 mg via INTRAVENOUS
  Filled 2022-09-04: qty 0.5

## 2022-09-04 MED ORDER — ONDANSETRON HCL 4 MG/2ML IJ SOLN
4.0000 mg | Freq: Once | INTRAMUSCULAR | Status: AC
  Administered 2022-09-05: 4 mg via INTRAVENOUS
  Filled 2022-09-04: qty 2

## 2022-09-04 MED ORDER — SODIUM CHLORIDE 0.9 % IV BOLUS
1000.0000 mL | Freq: Once | INTRAVENOUS | Status: AC
  Administered 2022-09-05: 1000 mL via INTRAVENOUS

## 2022-09-04 NOTE — ED Provider Notes (Signed)
[facility] Provider Note    Event Date/Time   First MD Initiated Contact with Patient 09/04/22 2319     (approximate)   History   Abdominal Pain   HPI  Tammy Villa is a 46 y.o. female who presents to the ED from home with a chief complaint of abdominal pain.  Patient reports epigastric abdominal pain after eating cheeseburger and fries today around noon time.  Pain associated with nausea.  Reports intermittent pain radiating around towards her left flank.  Thought it was her acid reflux as it took some Tums, took a nap but pain recurred.  Denies fever, cough, chest pain, shortness of breath, vomiting or diarrhea.     Past Medical History   Past Medical History:  Diagnosis Date   Asthma attack      Active Problem List   Patient Active Problem List   Diagnosis Date Noted   Early menopause 05/15/2018   Hyperlipidemia, mixed 02/17/2018     Past Surgical History   Past Surgical History:  Procedure Laterality Date   CESAREAN SECTION     DILATION AND CURETTAGE OF UTERUS     ENDOMETRIAL ABLATION     TUBAL LIGATION       Home Medications   Prior to Admission medications   Medication Sig Start Date End Date Taking? Authorizing Provider  albuterol (VENTOLIN HFA) 108 (90 Base) MCG/ACT inhaler Inhale into the lungs. 03/15/21 03/15/22  [provider]  Cyanocobalamin 2500 MCG SUBL Place under the tongue.    [provider]  escitalopram (LEXAPRO) 10 MG tablet Take 10 mg by mouth daily. 03/09/20   [provider]  estradiol (ESTRACE) 1 MG tablet TAKE 1 TABLET(1 MG) BY MOUTH EVERY DAY 12/30/19   [provider]  progesterone (PROMETRIUM) 100 MG capsule Take by mouth. 05/19/19 05/18/20  [provider]  progesterone (PROMETRIUM) 100 MG capsule Take by mouth. 07/04/20 07/04/21  [provider]  simvastatin (ZOCOR) 10 MG tablet Take 10 mg by mouth at bedtime. 02/08/20   [provider]  tretinoin  (RETIN-A) 0.025 % cream Apply topically. 03/03/21   [provider]     Allergies  Penicillins   Family History   Family History  Problem Relation Age of Onset   Hypertension Mother    Hypothyroidism Mother    Diabetes Maternal Grandmother    Esophageal cancer Maternal Grandfather    Breast cancer Neg Hx      Physical Exam  Triage Vital Signs: ED Triage Vitals  Enc Vitals Group     BP 09/04/22 2304 (!) 129/94     Pulse Rate 09/04/22 2304 (!) 58     Resp 09/04/22 2304 18     Temp 09/04/22 2304 98.8 F (37.1 C)     Temp Source 09/04/22 2304 Oral     SpO2 09/04/22 2304 97 %     Weight 09/04/22 2305 140 lb (63.5 kg)     Height 09/04/22 2305 5\' 4"  (1.626 m)     Head Circumference --      Peak Flow --      Pain Score 09/04/22 2305 8     Pain Loc --      Pain Edu? --      Excl. in [place]? --     Updated Vital Signs: BP (!) 129/94 (BP Location: Left Arm)   Pulse (!) 58   Temp 98.8 F (37.1 C) (Oral)   Resp 18   Ht 5'  4" (1.626 m)   Wt 63.5 kg   SpO2 97%   BMI 24.03 kg/m    General: Awake, mild distress.  CV:  RRR.  Good peripheral perfusion.  Resp:  Normal effort.  CTA B. Abd:  Mildly tender to palpation epigastrium without rebound or guarding.  No distention.  Other:  No truncal vesicles.   ED Results / Procedures / Treatments  Labs (all labs ordered are listed, but only abnormal results are displayed) Labs Reviewed  LIPASE, BLOOD  COMPREHENSIVE METABOLIC PANEL  CBC  URINALYSIS, ROUTINE W REFLEX MICROSCOPIC  POC URINE PREG, ED  TROPONIN I (HIGH SENSITIVITY)     EKG  ED ECG REPORT I, Royann Wildasin J, the attending physician, personally viewed and interpreted this ECG.   Date: 09/04/2022  EKG Time: 2308  Rate: 62  Rhythm: normal sinus rhythm  Axis: Normal  Intervals:none  ST&T Change: Nonspecific    RADIOLOGY I have independently visualized and interpreted patient's ultrasound as well as noted the radiology  interpretation: Ultrasound:  Official radiology report(s): No results found.   PROCEDURES:  Critical Care performed: {CriticalCareYesNo:19197::"Yes, see critical care procedure note(s)","No"}  .1-3 Lead EKG Interpretation  Performed by: Irean Hong, MD Authorized by: Irean Hong, MD     Interpretation: normal     ECG rate:  60   ECG rate assessment: normal     Rhythm: sinus rhythm     Ectopy: none     Conduction: normal   Comments:     Patient placed on cardiac monitor to evaluate for arrhythmias    MEDICATIONS ORDERED IN ED: Medications - No data to display   IMPRESSION / MDM / ASSESSMENT AND PLAN / ED COURSE  I reviewed the triage vital signs and the nursing notes.                             46 year old female presenting with epigastric pain. Differential diagnosis includes, but is not limited to, biliary disease (biliary colic, acute cholecystitis, cholangitis, choledocholithiasis, etc), intrathoracic causes for epigastric abdominal pain including ACS, gastritis, duodenitis, pancreatitis, small bowel or large bowel obstruction, abdominal aortic aneurysm, hernia, and ulcer(s).  I have personally reviewed patient's records and note a recent PCP office visit on 08/29/2022 for cervical occipital neuralgia.  Patient's presentation is most consistent with acute presentation with potential threat to life or bodily function.  The patient is on the cardiac monitor to evaluate for evidence of arrhythmia and/or significant heart rate changes.  We will obtain lab work including LFTs/lipase, right upper quadrant abdominal ultrasound to evaluate for cholecystitis.  Keep NPO, initiate IV fluid hydration, IV Pepcid for heartburn, IV Dilaudid for pain.  With IV Zofran for nausea.  Will reassess.      FINAL CLINICAL IMPRESSION(S) / ED DIAGNOSES   Final diagnoses:  Epigastric pain     Rx / DC Orders   ED Discharge Orders     None        Note:  This document was  prepared using Dragon voice recognition software and may include unintentional dictation errors.

## 2022-09-04 NOTE — ED Triage Notes (Signed)
Pt reports upper central abd pain since this afternoon that worsened after dinner. Described as constant pain/discomfort with intermittent sharp shooting pains. Reports pain radiates into chest. Denies GI or cardiac hx. Pt alert and oriented following commands. Breathing unlabored speaking in full sentences. Denies h/a, dizziness, vision change, parasthesia. Denies n/v.

## 2022-09-05 ENCOUNTER — Emergency Department: Payer: BC Managed Care – PPO

## 2022-09-05 LAB — TROPONIN I (HIGH SENSITIVITY): Troponin I (High Sensitivity): 4 ng/L (ref <18)

## 2022-09-05 MED ORDER — SUCRALFATE 1 GM/10ML PO SUSP: 1.0000 g | Freq: Four times a day (QID) | ORAL | 1 refills | Status: AC

## 2022-09-05 MED ORDER — ONDANSETRON 4 MG PO TBDP: 4.0000 mg | ORAL_TABLET | Freq: Three times a day (TID) | ORAL | 0 refills | Status: AC | PRN

## 2022-09-05 MED ORDER — HYDROCODONE-ACETAMINOPHEN 5-325 MG PO TABS: 1.0000 | ORAL_TABLET | Freq: Four times a day (QID) | ORAL | 0 refills | Status: AC | PRN

## 2022-09-05 MED ORDER — PANTOPRAZOLE SODIUM 40 MG PO TBEC: 40.0000 mg | DELAYED_RELEASE_TABLET | Freq: Every day | ORAL | 0 refills | Status: AC

## 2022-09-05 MED ORDER — POTASSIUM CHLORIDE CRYS ER 20 MEQ PO TBCR
40.0000 meq | EXTENDED_RELEASE_TABLET | Freq: Once | ORAL | Status: AC
  Administered 2022-09-05: 40 meq via ORAL
  Filled 2022-09-05: qty 2

## 2022-09-05 NOTE — Discharge Instructions (Addendum)
1.  Start Protonix 40 mg daily. 2.  Start Carafate 3 times daily with meals and at bedtime. 3.  You may take medicines as needed for pain and nausea (Norco/Zofran #20). 4.  Follow the gallbladder eating plan for a bland diet. 5.  Return to the ER for worsening symptoms, persistent vomiting, difficulty breathing or other concerns.

## 2022-12-07 ENCOUNTER — Other Ambulatory Visit: Payer: Self-pay

## 2022-12-07 DIAGNOSIS — Z1231 Encounter for screening mammogram for malignant neoplasm of breast: Secondary | ICD-10-CM

## 2022-12-28 ENCOUNTER — Ambulatory Visit
Admission: RE | Admit: 2022-12-28 | Discharge: 2022-12-28 | Disposition: A | Discharge status: Home / Self Care | Payer: BC Managed Care – PPO | Source: Ambulatory Visit | Attending: Internal Medicine | Admitting: Internal Medicine

## 2022-12-28 DIAGNOSIS — Z1231 Encounter for screening mammogram for malignant neoplasm of breast: Secondary | ICD-10-CM | POA: Insufficient documentation

## 2023-11-25 ENCOUNTER — Other Ambulatory Visit: Payer: Self-pay | Admitting: Internal Medicine

## 2023-11-25 DIAGNOSIS — Z1231 Encounter for screening mammogram for malignant neoplasm of breast: Secondary | ICD-10-CM

## 2023-12-30 ENCOUNTER — Ambulatory Visit
Admission: RE | Admit: 2023-12-30 | Discharge: 2023-12-30 | Disposition: A | Discharge status: Home / Self Care | Payer: Self-pay | Source: Ambulatory Visit | Attending: Internal Medicine | Admitting: Internal Medicine

## 2023-12-30 DIAGNOSIS — Z1231 Encounter for screening mammogram for malignant neoplasm of breast: Secondary | ICD-10-CM | POA: Diagnosis present

## 2024-02-05 ENCOUNTER — Ambulatory Visit: Payer: Self-pay

## 2024-02-05 DIAGNOSIS — Z83719 Family history of colon polyps, unspecified: Secondary | ICD-10-CM | POA: Diagnosis not present

## 2024-02-05 DIAGNOSIS — Z1211 Encounter for screening for malignant neoplasm of colon: Secondary | ICD-10-CM | POA: Diagnosis present
